# Patient Record
Sex: Female | Born: 1960 | Smoking: Never smoker
Health system: Southern US, Community
[De-identification: ages and names within clinical notes are randomized; demographics above are authoritative.]

## PROBLEM LIST (undated history)

## (undated) DIAGNOSIS — F32A Depression, unspecified: Secondary | ICD-10-CM

## (undated) DIAGNOSIS — F329 Major depressive disorder, single episode, unspecified: Secondary | ICD-10-CM

## (undated) DIAGNOSIS — J45909 Unspecified asthma, uncomplicated: Secondary | ICD-10-CM

## (undated) DIAGNOSIS — T7840XA Allergy, unspecified, initial encounter: Secondary | ICD-10-CM

## (undated) HISTORY — DX: Depression, unspecified: F32.A

## (undated) HISTORY — DX: Unspecified asthma, uncomplicated: J45.909

## (undated) HISTORY — DX: Allergy, unspecified, initial encounter: T78.40XA

## (undated) HISTORY — DX: Major depressive disorder, single episode, unspecified: F32.9

## (undated) HISTORY — PX: OTHER SURGICAL HISTORY: SHX169

## (undated) NOTE — Assessment & Plan Note (Signed)
Associated Problem(s): Asthma, exercise induced  Formatting of this note might be different from the original.  Stable. Well controlled. Continue prn treatment.   Electronically signed by Porfirio Oar, PA-C at 05/28/2022  3:18 PM EST

## (undated) NOTE — Progress Notes (Signed)
Formatting of this note is different from the original.  Images from the original note were not included.      Patient: Meghan Cooper      DOB: December 21, 1960, age 12 y.o.  MRN: [id number]    PCP: Porfirio Oar, PA-C    Assessment and Plan     1. Annual physical exam (Primary)  Comments:  age appropriate health guidance provided.  Orders:  -     CBC And Differential; Future; Expected date: 05/24/2022  -     Comprehensive Metabolic Panel; Future; Expected date: 05/31/2022  -     Lipid Panel With LDL/HDL Ratio; Future; Expected date: 05/31/2022  2. Encounter for screening mammogram for breast cancer  -     Mammo 3D Tomo Screening Bilateral; Future; Expected date: 06/23/2022  3. Vitamin D deficiency  -     Vitamin D 25 Hydroxy; Future; Expected date: 05/31/2022  4. Attention deficit hyperactivity disorder (ADHD), predominantly inattentive type  Assessment & Plan:  Stable. Well controlled. Continue current treatment per psychiatry.   5. Primary hypertension  Assessment & Plan:  Stable. Well controlled. Continue current treatment.    Orders:  -     CBC And Differential; Future; Expected date: 05/24/2022  -     Comprehensive Metabolic Panel; Future; Expected date: 05/31/2022  -     Urinalysis; Future; Expected date: 05/24/2022  6. Screening for colon cancer  -     Ambulatory referral to Gastroenterology  7. Asthma, exercise induced  Assessment & Plan:  Stable. Well controlled. Continue prn treatment.   Orders:  -     albuterol sulfate HFA (PROVENTIL,VENTOLIN,PROAIR) 108 (90 Base) MCG/ACT inhaler; Inhale two puffs into the lungs every 4 (four) hours as needed for Wheezing or Shortness of Breath., Starting Tue 05/24/2022, Normal      Follow up for Annual Exam, and fasting labs, sooner if needed.    Risks, benefits, and alternatives of the medications and treatment plan prescribed today were discussed, and patient expressed understanding. Plan follow-up as discussed or as needed if any worsening symptoms or change in condition.   Patient voiced understanding of the treatment plan and agreed to attempt to comply.      Subjective     This is a 63 y.o. female who presents with:     Patient presents with    Annual Exam       Chief complaint comments reviewed and discussed with patient in detail.    HPI     This 7 y.o. female presents for Annual Exam.    Cervical Cancer Screening: Last pap 12/06/2017, normal cytology, negative HPV. History of previous abnormal pap? no.  Breast Cancer Screening: Last mammogram 07/15/2021, normal. SBE? yes. Annual CBE? yes.  Colorectal Cancer Screening: not yet performed  Bone Density Testing: not yet a candidate  HIV Screening: declines  STI Screening: declines  COVID-19 Vaccination: current  Seasonal Influenza Vaccination: current  Td/Tdap Vaccination: Tdap 08/14/2019  Pneumococcal Vaccination: not yet a candidate  Zoster Vaccination: defers  Frequency of Dental evaluation: Q6 months  Frequency of Eye evaluation: annually    ADHD.  Stable and controlled. Managed by [employer].    HTN.  Continues to tolerate amlodipine.    Foot pain, due to arthritis, LEFT tibialis posterior tendonitis, meloxicam use.  Meloxicam eases foot pain and allows for regular exercise.    Notes occasional BRBPR attributed to a known hemorrhoid.  Episodic LEFT trapezius spasm associated with stress.    Last Resulted  Components       Date/Time Component Value Flag Units Reference Range Lab Status    04/28/21 1024 CHOL 264 High mg/dL 756 - 433 mg/dL Final result    29/51/88 1024 HDL 75 -- mg/dL >41 mg/dL Final result    66/06/30 1024 LDL 178 High mg/dL 0 - 99 mg/dL Final result    16/01/09 1024 TRIG 67 -- mg/dL 0 - 323 mg/dL Final result    55/73/22 1024 GLUCOSE 97 -- mg/dL 70 - 99 mg/dL Final result         Lab Results   Component Value Date    Creatinine 0.77 05/24/2022     Review of systems:  Constitutional: (-) Fatigue, (-) Unexpected weight change, (-) Fever, (-) Appetite change  ENT:  (-) Tinnitus, (-) Trouble  swallowing  Eyes: (-) Photophobia, (-) Visual disturbance  Lungs: (-) Cough, (-) Shortness of breath, (-) Wheezing,(-) Hemoptysis  Heart: (-) Chest pain, (-) Leg swelling, (-) Palpitations  Gastrointestinal: (-) Abdominal pain, (-) Nausea,  (-) Constipation, (-) Diarrhea,  (-) Blood in stool  Endocrine: (-) Polydipsia (-) Polyuria  Genitourinary: (-) Urinary urgency,  (-) Dyspareunia,  (-) Difficulty urinating  Musculoskeletal: (-) Arthralgia,  (+) Myalgia  Skin: (-) Rash,  (-) Skin color change,  (-) Wound  Allergy: (-) Environmental allergies (-) Food allergies  Neurologic: (-) Weakness, (-) Numbness,  (+) Sleep disturbance, (-) Headache  Hematology: (-) Adenopathy,  (-) Easy bruising or bleeding  Psychiatric: (-) Dysphoria,  (-) Anxious        Patient Active Problem List   Diagnosis    Asthma, exercise induced    BMI 34.0-34.9,adult    Perimenopause    Vitamin D deficiency    Rosacea    Class 1 obesity without serious comorbidity with body mass index (BMI) of 34.0 to 34.9 in adult    Tibialis posterior tendonitis, left    Arthritis of foot    Attention deficit hyperactivity disorder (ADHD), predominantly inattentive type    Primary hypertension     Past Medical History:   Diagnosis Date    ADHD (attention deficit hyperactivity disorder)     Arthritis 11/21    In left ankle and fingers    Asthma, exercise induced 10/03/2013    Hemorrhoids     Hypertension 12/24    Posterior tibial artery injury     LEFT, Dr. Charlsie Merles    Rosacea     Stress fracture of left ankle      Allergies   Allergen Reactions    Codeine Rash     Rash    Loracarbef Rash     Rash     Past Surgical History:   Procedure Laterality Date    Bunionectomy Left 1997     Family History   Problem Relation Age of Onset    Lung cancer Mother     Cancer Mother         Lung Cancer, passed in 1996    Heart disease Father 66        deceased    Heart attack Father     No Known Problems Brother     Hypertension Maternal Grandmother         deceased     Social  History     Socioeconomic History    Marital status: Married     Spouse name: Rob Press photographer    Number of children: 0    Highest education level: Biochemist, clinical History  Occupation: Professor of Chemistry     Comment: Doctor, general practice since 1991   Tobacco Use    Smoking status: Never     Passive exposure: Never    Smokeless tobacco: Never   Substance and Sexual Activity    Alcohol use: Yes     Alcohol/week: 10.0 standard drinks of alcohol     Types: 10 Glasses of wine per week    Drug use: Never    Sexual activity: Yes     Partners: Male     Birth control/protection: Post-menopausal   Social History Narrative    Lives with her husband, and their cats       Objective     Vitals:    05/24/22 0855   BP: 124/78   Patient Position: Sitting   Pulse: 87   Temp: 97.5 F (36.4 C)   TempSrc: Temporal   Height: 5\' 4"  (1.626 m)   Weight: Comment: Patirnt declined   SpO2: 97%   PainSc: 0-No pain     Wt Readings from Last 3 Encounters:   12/06/17 203 lb 9.6 oz (92.4 kg)     Physical Exam  Constitutional:       General: She is not in acute distress.     Appearance: Normal appearance. She is well-developed.   HENT:      Head: Normocephalic and atraumatic.      Right Ear: Hearing, tympanic membrane, ear canal and external ear normal.      Left Ear: Hearing, tympanic membrane, ear canal and external ear normal.      Nose: Nose normal.      Mouth/Throat:      Mouth: Mucous membranes are moist. No oral lesions.      Dentition: Normal dentition.      Pharynx: Oropharynx is clear. Uvula midline.   Eyes:      General: Lids are normal. No scleral icterus.        Right eye: No discharge or hordeolum.         Left eye: No discharge or hordeolum.      Conjunctiva/sclera: Conjunctivae normal.      Right eye: No exudate.     Left eye: No exudate.     Pupils: Pupils are equal, round, and reactive to light.      Funduscopic exam:     Right eye: No hemorrhage, exudate or papilledema.         Left eye: No hemorrhage, exudate or  papilledema.   Neck:      Thyroid: No thyroid mass or thyromegaly.      Trachea: Phonation normal.   Cardiovascular:      Rate and Rhythm: Normal rate and regular rhythm. No extrasystoles are present.     Pulses:           Radial pulses are 2+ on the right side and 2+ on the left side.      Heart sounds: Normal heart sounds. No murmur heard.     No friction rub. No gallop.   Musculoskeletal:      Cervical back: Full passive range of motion without pain, normal range of motion and neck supple.      Thoracic back: Normal.      Lumbar back: Normal.      Right hip: Normal.      Left hip: Normal.      Right knee: Normal.      Left knee: Normal.      Right ankle: Normal.  Left ankle: Normal.   Pulmonary:      Effort: Pulmonary effort is normal.      Breath sounds: Normal breath sounds.   Chest:   Breasts:     Breasts are symmetrical.      Right: No inverted nipple, mass, nipple discharge, skin change or tenderness.      Left: No inverted nipple, mass, nipple discharge, skin change or tenderness.   Abdominal:      General: Bowel sounds are normal.      Palpations: Abdomen is soft.      Tenderness: There is no abdominal tenderness.      Hernia: No hernia is present.   Lymphadenopathy:      Head:      Right side of head: No submental, submandibular or tonsillar adenopathy.      Left side of head: No submental, submandibular or tonsillar adenopathy.      Cervical: No cervical adenopathy.      Upper Body:      Right upper body: No supraclavicular adenopathy.      Left upper body: No supraclavicular adenopathy.   Skin:     General: Skin is warm and dry.      Findings: No rash.   Neurological:      General: No focal deficit present.      Mental Status: She is alert and oriented to person, place, and time.      Cranial Nerves: No cranial nerve deficit.      Sensory: No sensory deficit.      Deep Tendon Reflexes:      Reflex Scores:       Bicep reflexes are 2+ on the right side and 2+ on the left side.       Patellar reflexes  are 2+ on the right side and 2+ on the left side.  Psychiatric:         Attention and Perception: Attention and perception normal.         Mood and Affect: Mood and affect normal.         Speech: Speech normal.         Behavior: Behavior normal. Behavior is cooperative.         Thought Content: Thought content normal.         Cognition and Memory: Cognition and memory normal.         Judgment: Judgment normal.         Fernande Bras, PA-C      Electronically signed by Porfirio Oar, PA-C at 05/28/2022  3:23 PM EST

## (undated) NOTE — Assessment & Plan Note (Signed)
Associated Problem(s): Attention deficit hyperactivity disorder (ADHD), predominantly inattentive type  Formatting of this note might be different from the original.  Stable. Well controlled. Continue current treatment per psychiatry.   Electronically signed by Porfirio Oar, PA-C at 05/28/2022  3:18 PM EST

## (undated) NOTE — Assessment & Plan Note (Signed)
Associated Problem(s): Primary hypertension  Formatting of this note might be different from the original.  Stable. Well controlled. Continue current treatment.    Electronically signed by Porfirio Oar, PA-C at 05/28/2022  3:18 PM EST

---

## 2001-12-17 ENCOUNTER — Other Ambulatory Visit: Admission: RE | Admit: 2001-12-17 | Discharge: 2001-12-17 | Payer: Self-pay | Admitting: *Deleted

## 2004-02-24 ENCOUNTER — Other Ambulatory Visit: Admission: RE | Admit: 2004-02-24 | Discharge: 2004-02-24 | Payer: Self-pay | Admitting: *Deleted

## 2008-08-16 IMAGING — CT CT ABD-PELV W/O CM
3 of 8 series · 12 of 42 positions shown, 18 images · IV contrast (CONTRAST)
Comparison: NONE

CLINICAL DATA: Lower abdominal pain and cramping. 

CT ABDOMEN AND PELVIS WITHOUT AND WITH INTRAVENOUS AND FOLLOWING 
ORAL  CONTRAST
TECHNIQUE: Multiple axial 5-millimeter thick slices at 
5-millimeter intervals were obtained from the lung base through 
the pelvis following the intravenous administration of 100 cc of 
Optiray 350 at a rate of 3 cc per second.  Oral contrast was 
administered as well.  Arterial and venous phase imaging was 
obtained in the upper abdomen with delayed images obtained through 
the pelvis.

[Series 4: venous · axial · portal-venous · 0.64mm/px · z∈[+651,+931]mm · 5 of 85 slices shown, 10 images]
[im 15/85  soft-tissue]
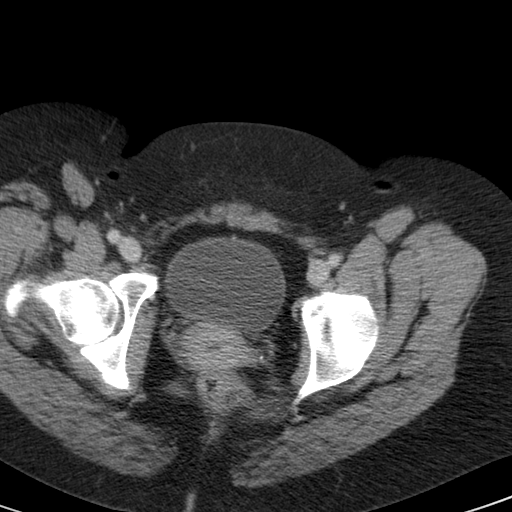
[im 15/85  bone]
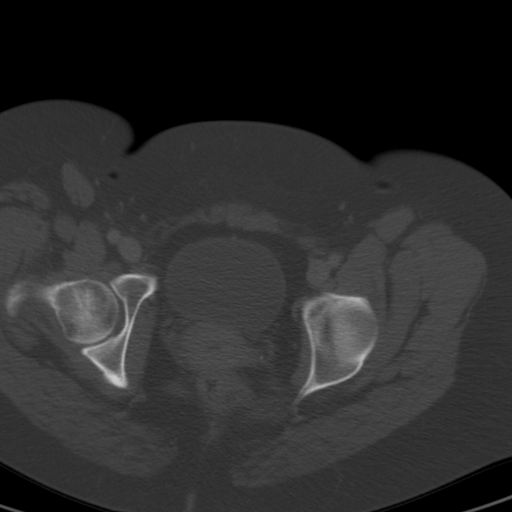
[im 29/85  soft-tissue]
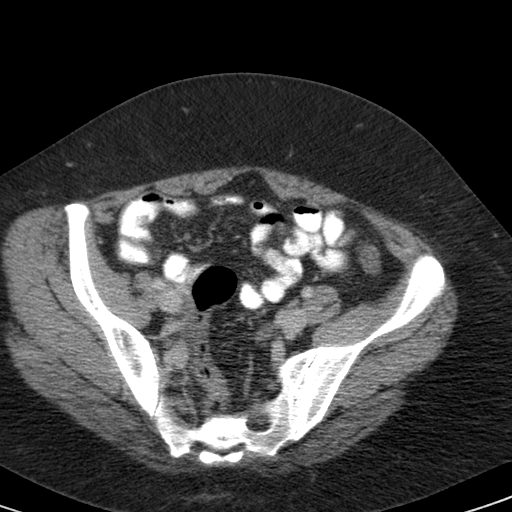
[im 29/85  lung]
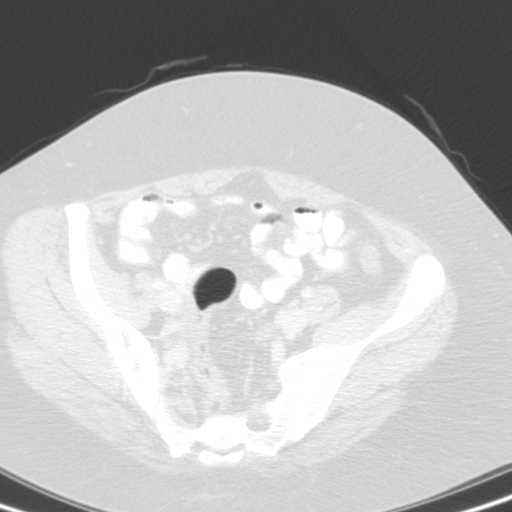
[im 43/85  soft-tissue]
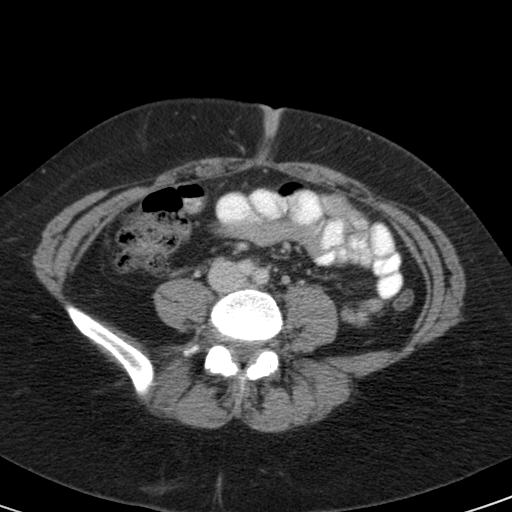
[im 43/85  lung]
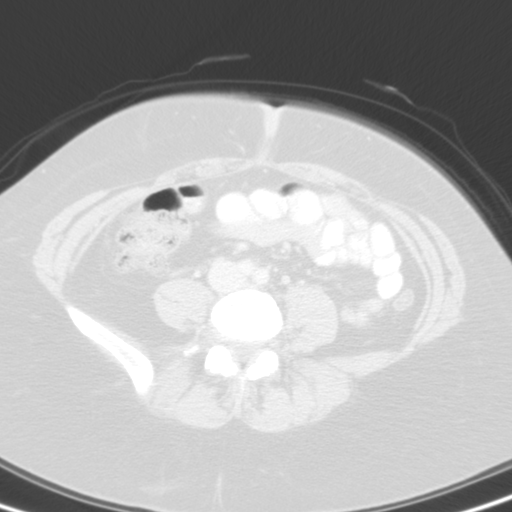
[im 57/85  soft-tissue]
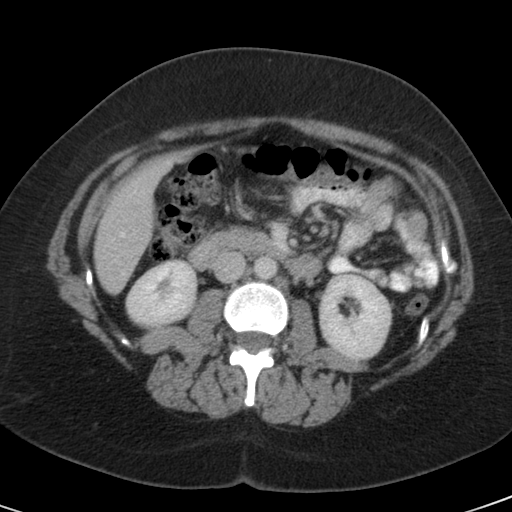
[im 57/85  lung]
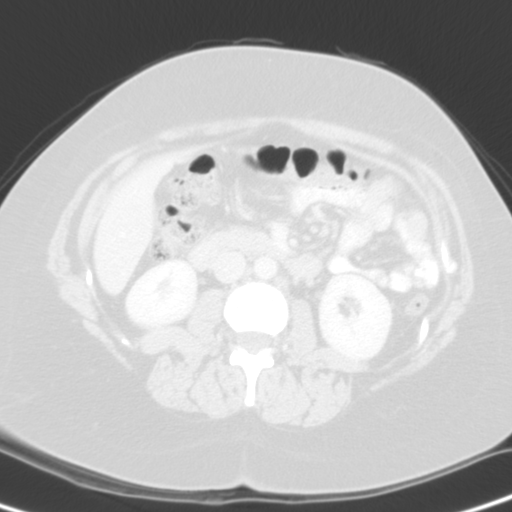
[im 71/85  soft-tissue]
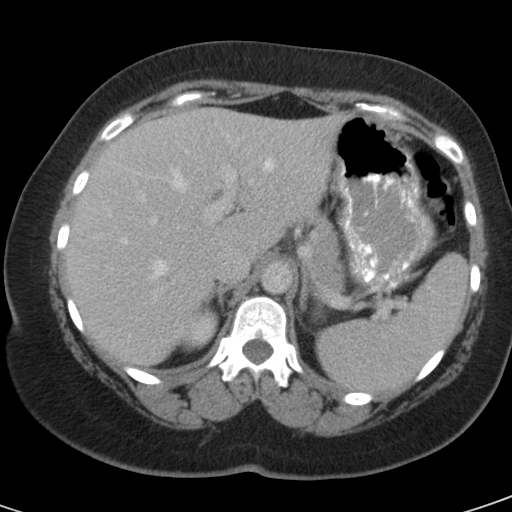
[im 71/85  lung]
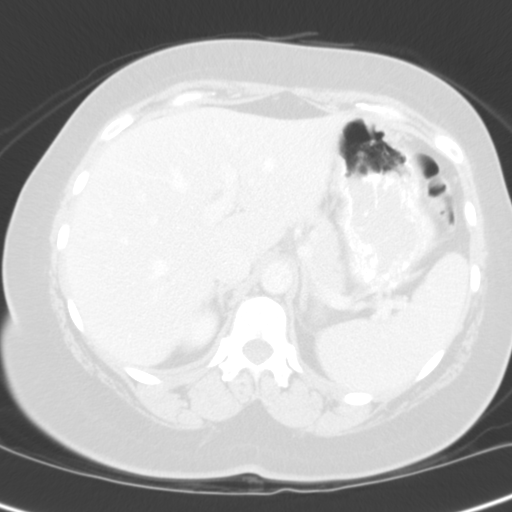

[Series 9: bladder delays · axial · 0.64mm/px · z∈[+684,+894]mm · 4 of 70 slices shown]
[im 14/70  soft-tissue]
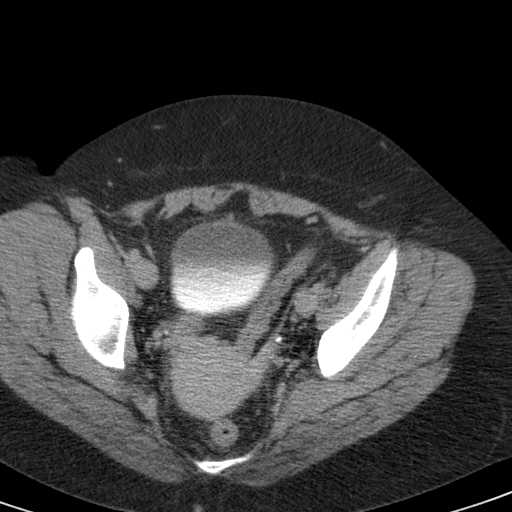
[im 28/70  soft-tissue]
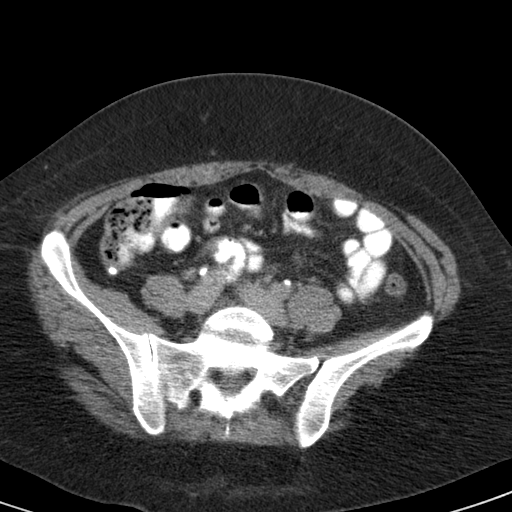
[im 42/70  soft-tissue]
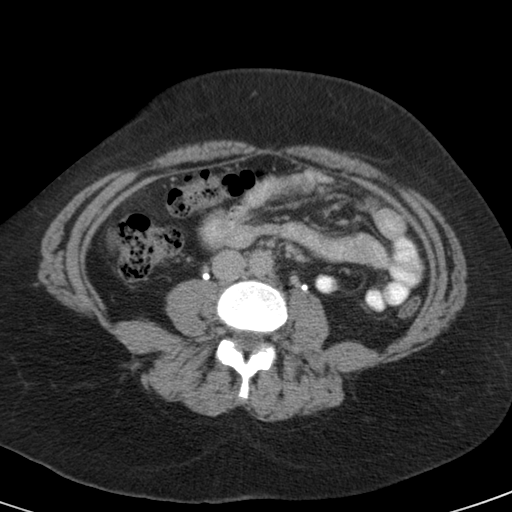
[im 56/70  soft-tissue]
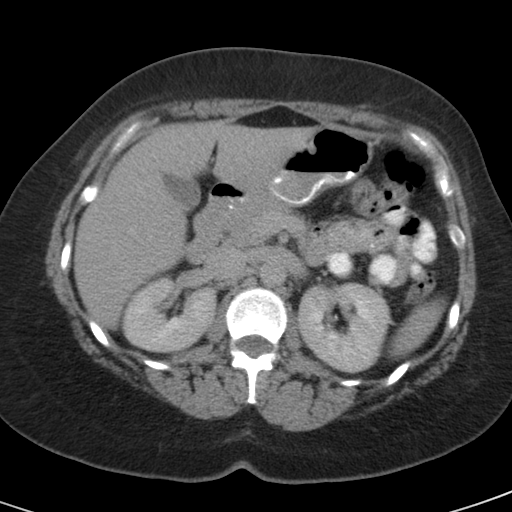

[coronals · coronal · 0.82mm/px · 3 of 72 slices shown, 4 images]
[im 24/72  soft-tissue]
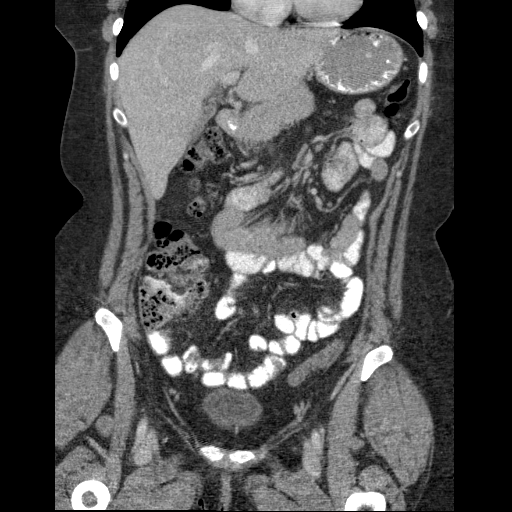
[im 32/72  soft-tissue]
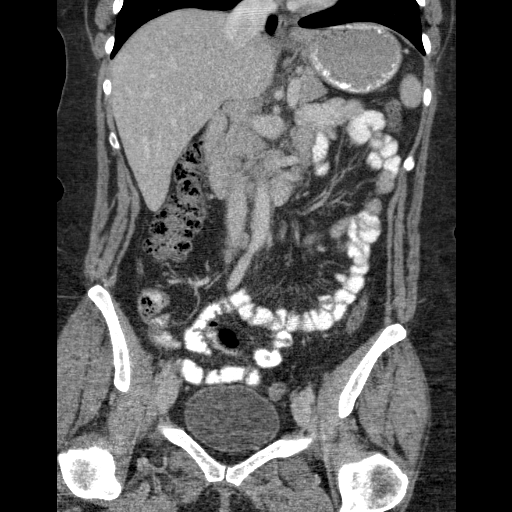
[im 32/72  bone]
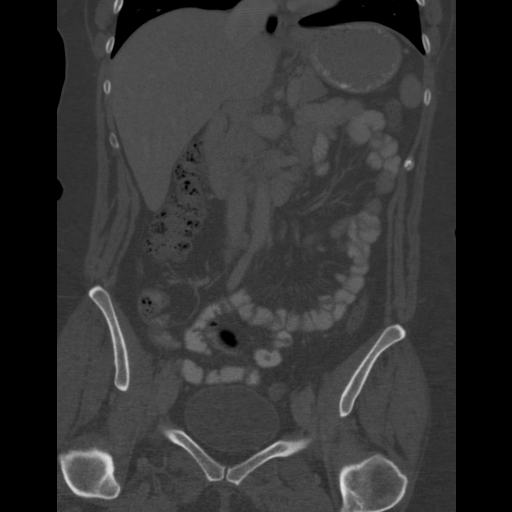
[im 40/72  soft-tissue]
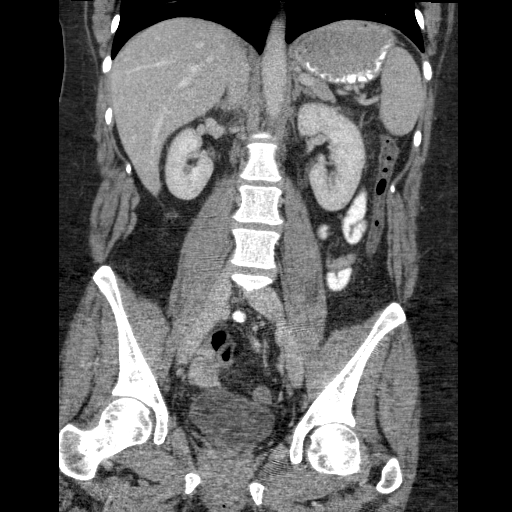

[12 of 42 positions shown; findings below may reference images not displayed]

FINDINGS: The liver, pancreas, spleen, kidneys, and aorta are 
unremarkable.  No adrenal or renal mass or evidence of renal 
obstruction. There is a calcification in the right lower quadrant 
measuring approximately 5-6 mm.  The appendix does not appear 
inflamed or enlarged. No evidence of appendicitis, diverticulitis, 
or hernia. No lung base mass, infiltrate, edema, or effusion. No 
lytic or blastic lesions are identified.
IMPRESSION: Appendicolith without direct or indirect evidence of 
appendicitis. Ronlor, Ingunn Harpa electronically reviewed on 
08/29/2006 Dict Date: 08/28/2006  Tran Date: 08/29/2006 DAS  JLM

## 2009-11-17 ENCOUNTER — Other Ambulatory Visit: Admission: RE | Admit: 2009-11-17 | Discharge: 2009-11-17 | Payer: Self-pay | Admitting: Family Medicine

## 2011-04-27 ENCOUNTER — Ambulatory Visit (INDEPENDENT_AMBULATORY_CARE_PROVIDER_SITE_OTHER): Payer: 59

## 2011-04-27 DIAGNOSIS — J45909 Unspecified asthma, uncomplicated: Secondary | ICD-10-CM

## 2012-02-09 ENCOUNTER — Encounter: Payer: Self-pay | Admitting: Family Medicine

## 2012-02-09 ENCOUNTER — Ambulatory Visit (INDEPENDENT_AMBULATORY_CARE_PROVIDER_SITE_OTHER): Payer: 59 | Admitting: Family Medicine

## 2012-02-09 VITALS — BP 126/86 | HR 60 | Temp 98.1°F | Resp 16 | Ht 64.0 in | Wt 177.8 lb

## 2012-02-09 DIAGNOSIS — Z23 Encounter for immunization: Secondary | ICD-10-CM

## 2012-02-09 DIAGNOSIS — Z01419 Encounter for gynecological examination (general) (routine) without abnormal findings: Secondary | ICD-10-CM

## 2012-02-09 DIAGNOSIS — Z1211 Encounter for screening for malignant neoplasm of colon: Secondary | ICD-10-CM

## 2012-02-09 DIAGNOSIS — N951 Menopausal and female climacteric states: Secondary | ICD-10-CM | POA: Insufficient documentation

## 2012-02-09 DIAGNOSIS — Z13220 Encounter for screening for lipoid disorders: Secondary | ICD-10-CM

## 2012-02-09 DIAGNOSIS — E559 Vitamin D deficiency, unspecified: Secondary | ICD-10-CM

## 2012-02-09 DIAGNOSIS — Z0000 Encounter for general adult medical examination without abnormal findings: Secondary | ICD-10-CM

## 2012-02-09 DIAGNOSIS — Z1231 Encounter for screening mammogram for malignant neoplasm of breast: Secondary | ICD-10-CM

## 2012-02-09 DIAGNOSIS — E669 Obesity, unspecified: Secondary | ICD-10-CM

## 2012-02-09 LAB — COMPREHENSIVE METABOLIC PANEL
AST: 21 U/L (ref 0–37)
BUN: 17 mg/dL (ref 6–23)
Calcium: 9.4 mg/dL (ref 8.4–10.5)
Chloride: 104 mEq/L (ref 96–112)
Creat: 0.82 mg/dL (ref 0.50–1.10)
Total Bilirubin: 0.7 mg/dL (ref 0.3–1.2)

## 2012-02-09 LAB — POCT URINALYSIS DIPSTICK
Bilirubin, UA: NEGATIVE
Blood, UA: NEGATIVE
Glucose, UA: NEGATIVE
Spec Grav, UA: 1.01

## 2012-02-09 LAB — CBC WITH DIFFERENTIAL/PLATELET
Basophils Absolute: 0 10*3/uL (ref 0.0–0.1)
Eosinophils Absolute: 0.1 10*3/uL (ref 0.0–0.7)
Eosinophils Relative: 1 % (ref 0–5)
Lymphocytes Relative: 37 % (ref 12–46)
MCH: 31.2 pg (ref 26.0–34.0)
MCV: 92.5 fL (ref 78.0–100.0)
Neutrophils Relative %: 51 % (ref 43–77)
Platelets: 216 10*3/uL (ref 150–400)
RDW: 13.5 % (ref 11.5–15.5)
WBC: 4.3 10*3/uL (ref 4.0–10.5)

## 2012-02-09 LAB — LIPID PANEL
HDL: 83 mg/dL (ref >39)
Total CHOL/HDL Ratio: 2.8 Ratio
VLDL: 10 mg/dL (ref 0–40)

## 2012-02-09 NOTE — Patient Instructions (Signed)
Keeping You Healthy  Get These Tests  Blood Pressure- Have your blood pressure checked by your healthcare provider at least once a year.  Normal blood pressure is 120/80.  Weight- Have your body mass index (BMI) calculated to screen for obesity.  BMI is a measure of body fat based on height and weight.  You can calculate your own BMI at https://www.west-esparza.com/  Cholesterol- Have your cholesterol checked every year.  Diabetes- Have your blood sugar checked every year if you have high blood pressure, high cholesterol, a family history of diabetes or if you are overweight.  Pap Smear- Have a pap smear every 1 to 3 years if you have been sexually active.  If you are older than 65 and recent pap smears have been normal you may not need additional pap smears.  In addition, if you have had a hysterectomy  For benign disease additional pap smears are not necessary.  Mammogram-Yearly mammograms are essential for early detection of breast cancer  Screening for Colon Cancer- Colonoscopy starting at age 79. Screening may begin sooner depending on your family history and other health conditions.  Follow up colonoscopy as directed by your Gastroenterologist.  Screening for Osteoporosis- Screening begins at age 74 with bone density scanning, sooner if you are at higher risk for developing Osteoporosis.  Get these medicines  Calcium with Vitamin D- Your body requires 1200-1500 mg of Calcium a day and [phone] IU of Vitamin D a day.  You can only absorb 500 mg of Calcium at a time therefore Calcium must be taken in 2 or 3 separate doses throughout the day.  Hormones- Hormone therapy has been associated with increased risk for certain cancers and heart disease.  Talk to your healthcare provider about if you need relief from menopausal symptoms.  Aspirin- Ask your healthcare provider about taking Aspirin to prevent Heart Disease and Stroke.  Get these Immuniztions  Flu shot- Every fall You received your  2012-2013 Flu vaccine today.  Pneumonia shot- Once after the age of 48; if you are younger ask your healthcare provider if you need a pneumonia shot.  Tetanus- Every ten years.  You had T-DaP in July 2011 at [place] [facility]  Zostavax- Once after the age of 35 to prevent shingles.  Take these steps  Don't smoke- Your healthcare provider can help you quit. For tips on how to quit, ask your healthcare provider or go to www.smokefree.gov or call 1-800 QUIT-NOW.  Be physically active- Exercise 5 days a week for a minimum of 30 minutes.  If you are not already physically active, start slow and gradually work up to 30 minutes of moderate physical activity.  Try walking, dancing, bike riding, swimming, etc.  Eat a healthy diet- Eat a variety of healthy foods such as fruits, vegetables, whole grains, low fat milk, low fat cheeses, yogurt, lean meats, chicken, fish, eggs, dried beans, tofu, etc.  For more information go to www.thenutritionsource.org  Dental visit- Brush and floss teeth twice daily; visit your dentist twice a year.  Eye exam- Visit your Optometrist or Ophthalmologist yearly.  Drink alcohol in moderation- Limit alcohol intake to one drink or less a day.  Never drink and drive.  Depression- Your emotional health is as important as your physical health.  If you're feeling down or losing interest in things you normally enjoy, please talk to your healthcare provider.  Seat Belts- can save your life; always wear one  Smoke/Carbon Monoxide detectors- These detectors need to be installed  on the appropriate level of your home.  Replace batteries at least once a year.  Violence- If anyone is threatening or hurting you, please tell your healthcare provider.  Living Will/ Health care power of attorney- Discuss with your healthcare provider and family.   Vitamin D Deficiency Vitamin D is an important vitamin that your body needs. Having too little of it in your body is called a  deficiency. A very bad deficiency can make your bones soft and can cause a condition called rickets.  Vitamin D is important to your body for different reasons, such as:   It helps your body absorb 2 minerals called calcium and phosphorus.  It helps make your bones healthy.  It may prevent some diseases, such as diabetes and multiple sclerosis.  It helps your muscles and heart. You can get vitamin D in several ways. It is a natural part of some foods. The vitamin is also added to some dairy products and cereals. Some people take vitamin D supplements. Also, your body makes vitamin D when you are in the sun. It changes the sun's rays into a form of the vitamin that your body can use. CAUSES   Not eating enough foods that contain vitamin D.  Not getting enough sunlight.  Having certain digestive system diseases that make it hard to absorb vitamin D. These diseases include Crohn's disease, chronic pancreatitis, and cystic fibrosis.  Having a surgery in which part of the stomach or small intestine is removed.  Being obese. Fat cells pull vitamin D out of your blood. That means that obese people may not have enough vitamin D left in their blood and in other body tissues.  Having chronic kidney or liver disease. RISK FACTORS Risk factors are things that make you more likely to develop a vitamin D deficiency. They include:  Being older.  Not being able to get outside very much.  Living in a nursing home.  Having had broken bones.  Having weak or thin bones (osteoporosis).  Having a disease or condition that changes how your body absorbs vitamin D.  Having dark skin.  Some medicines such as seizure medicines or steroids.  Being overweight or obese. SYMPTOMS Mild cases of vitamin D deficiency may not have any symptoms. If you have a very bad case, symptoms may include:  Bone pain.  Muscle pain.  Falling often.  Broken bones caused by a minor injury, due to  osteoporosis. DIAGNOSIS A blood test is the best way to tell if you have a vitamin D deficiency. TREATMENT Vitamin D deficiency can be treated in different ways. Treatment for vitamin D deficiency depends on what is causing it. Options include:  Taking vitamin D supplements.  Taking a calcium supplement. Your caregiver will suggest what dose is best for you. HOME CARE INSTRUCTIONS  Take any supplements that your caregiver prescribes. Follow the directions carefully. Take only the suggested amount.  Have your blood tested 2 months after you start taking supplements.  Eat foods that contain vitamin D. Healthy choices include:  Fortified dairy products, cereals, or juices. Fortified means vitamin D has been added to the food. Check the label on the package to be sure.  Fatty fish like salmon or trout.  Eggs.  Oysters.  Spend some time in the sun. Most people should get out in the sun without sunblock for 10 to 15 minutes at a time. Do this 3 times a week. People who have had skin cancer should not do this. Ask  your caregiver how long you should be in the sun. Do not use a tanning bed.  Keep your weight at a healthy level. Lose weight if you need to.  Keep all follow-up appointments. Your caregiver will need to perform blood tests to make sure your vitamin D deficiency is going away. SEEK MEDICAL CARE IF:  You have any questions about your treatment.  You continue to have symptoms of vitamin D deficiency.  You have nausea or vomiting.  You are constipated.  You feel confused.  You have severe abdominal or back pain. MAKE SURE YOU:  Understand these instructions.  Will watch your condition.  Will get help right away if you are not doing well or get worse. Document Released: 07/04/2011 Document Reviewed: 07/04/2011 [facility] Patient Information 2013 [place], [place].    Menopause and Herbal Products Menopause is the normal time of life when menstrual periods stop  completely. Menopause is complete when you have missed 12 consecutive menstrual periods. It usually occurs between the ages of 51 to 75, with an average age of 105. Very rarely does a woman develop menopause before 51 years old. At menopause, your ovaries stop producing the female hormones, estrogen and progesterone. This can cause undesirable symptoms and also affect your health. Sometimes the symptoms can occur 4 to 5 years before the menopause begins. There is no relationship between menopause and:  Oral contraceptives.  Number of children you had.  Race.  The age your menstrual periods started (menarche). Heavy smokers and very thin women may develop menopause earlier in life. Estrogen and progesterone hormone treatment is the usual method of treating menopausal symptoms. However, there are women who should not take hormone treatment. This is true of:   Women that have breast or uterine cancer.  Women who prefer not to take hormones because of certain side effects (abnormal uterine bleeding).  Women who are afraid that hormones may cause breast cancer.  Women who have a history of liver disease, heart disease, stroke, or blood clots. For these women, there are other medications that may help treat their menopausal symptoms. These medications are found in plants and botanical products. They can be found in the form of herbs, teas, oils, tinctures, and pills.  CAUSES:  The ovaries stop producing the female hormones estrogen and progesterone.  Other causes include:  Surgery to remove both ovaries.  The ovaries stop functioning for no know reason.  Tumors of the pituitary gland in the brain.  Medical disease that affects the ovaries and hormone production.  Radiation treatment to the abdomen or pelvis.  Chemotherapy that affects the ovaries. PHYTOESTROGENS: Phytoestrogen's occur naturally in plants and plant products. They act like estrogen in the body. Herbal medications are  made from these plants and botanical steroids. There are 3 types of phytoestrogens:  Isoflavones (genistein and daidzein) are found in soy, garbanzo beans, miso and tofu foods.  Ligins are found in the shell of seeds. They are used to make oils like flaxseed oil. The bacteria in your intestine act on these foods to produce the estrogen-like hormones.  Coumestans are estrogen-like. Some of the foods they are found in include sunflower seeds and bean sprouts. CONDITIONS AND THERE POSSIBLE HERBAL TREATMENT:  Hot flashes and night sweats.  Soy, black cohosh and evening primrose.  Irritability, insomnia, depression and memory problems.  Chasteberry, ginseng, and soy.  St. John's wort may be helpful for depression. However, there is a concern of it causing cataracts of the eye and may have  bad effects on other medications. St. John's wort should not be taken for long time and without your caregiver's advice.  Loss of libido and vaginal and skin dryness.  Wild yam and soy.  Prevention of coronary heart disease and osteoporosis.  Soy and Isoflavones. Several studies have shown that some women benefit from herbal medications, but most of the studies have not consistently shown that these supplements are much better than placebo. Other forms of treatment to help women with menopausal symptoms include a balanced diet, rest, exercise, vitamin and calcium (with vitamin D) supplements, acupuncture, and group therapy when necessary. THOSE WHO SHOULD NOT TAKE HERBAL MEDICATIONS INCLUDE:  Women who are planning on getting pregnant unless told by your caregiver.  Women who are breastfeeding unless told by your caregiver.  Women who are taking other prescription medications unless told by your caregiver.  Infants, children, and elderly women unless told by your caregiver. Different herbal medications have different and unmeasured amounts of the herbal ingredients. There are no regulations, quality  control, and standardization of the ingredients in herbal medications. Therefore, the amount of the ingredient in the medication may vary from one herb, pill, tea, oil or tincture to another. Many herbal medications can cause serious problems and can even have poisonous effects if taken too much or too long. If problems develop, the medication should be stopped and recorded by your caregiver. HOME CARE INSTRUCTIONS  Do not take or give children herbal medications without your caregiver's advice.  Let your caregiver know all the medications you are taking. This includes prescription, over-the-counter, eye drops, and creams.  Do not take herbal medications longer or more than recommended.  Tell your caregiver about any side effects from the medication. SEEK MEDICAL CARE IF:  You develop a fever of 102 F (38.9 C), or as directed by your caregiver.  You feel sick to your stomach (nauseous), vomit, or have diarrhea.  You develop a rash.  You develop abdominal pain.  You develop severe headaches.  You start to have vision problems.  You feel dizzy or faint.  You start to feel numbness in any part of your body.  You start shaking (have convulsions). Document Released: 09/28/2007 Document Revised: 07/04/2011 Document Reviewed: 04/27/2010 [facility] Patient Information 2013 [place], [place].

## 2012-02-09 NOTE — Progress Notes (Signed)
Subjective:    Patient ID: Lori Reed, female    DOB: 02-02-1961, 51 y.o.   MRN: [id number]  HPI  This 51 y.o. Cauc female is here for CPE/PAP. She is in good health and takes no prescription  medications.  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 She does  consume wine almost daily and exercises daily for 1-2 hours.   Last PAP:  11/2009 (normal)  Last MMG: 11/2009 (normal)  CRS: has not had   Review of Systems  Constitutional: Negative.   HENT: Negative.   Eyes: Negative.   Respiratory: Negative.   Cardiovascular: Negative.   Gastrointestinal: Negative.   Genitourinary: Negative.   Musculoskeletal: Negative.   Skin: Negative.   Neurological: Negative.   Hematological: Negative.   Psychiatric/Behavioral: Negative.        Objective:   Physical Exam  Nursing note and vitals reviewed. Constitutional: She is oriented to person, place, and time. She appears well-developed and well-nourished. No distress.  HENT:  Head: Normocephalic and atraumatic.  Right Ear: Hearing, tympanic membrane, external ear and ear canal normal.  Left Ear: Hearing, tympanic membrane, external ear and ear canal normal.  Nose: Nose normal. No nasal deformity or septal deviation.  Mouth/Throat: Uvula is midline, oropharynx is clear and moist and mucous membranes are normal. No oral lesions. Normal dentition. No dental caries.  Eyes: Conjunctivae normal, EOM and lids are normal. Pupils are equal, round, and reactive to light. No scleral icterus.  Fundoscopic exam:      The right eye shows no arteriolar narrowing, no AV nicking and no papilledema. The right eye shows red reflex.      The left eye shows AV nicking. The left eye shows no arteriolar narrowing and no papilledema. The left eye shows red reflex. Neck: Normal range of motion. Neck supple. No thyromegaly present.  Cardiovascular: Normal rate, regular rhythm, normal heart sounds and intact distal pulses.  Exam reveals  no gallop and no friction rub.   No murmur heard. Pulmonary/Chest: Effort normal and breath sounds normal. No respiratory distress.  Abdominal: Soft. Normal appearance and bowel sounds are normal. She exhibits no distension, no abdominal bruit, no pulsatile midline mass and no mass. There is no hepatosplenomegaly. There is no tenderness. There is no guarding and no CVA tenderness. No hernia.  Genitourinary: Rectum normal. Rectal exam shows no external hemorrhoid, no fissure, no mass, no tenderness and anal tone normal. Guaiac negative stool. No breast swelling, tenderness, discharge or bleeding. There is no rash, tenderness or lesion on the right labia. There is no rash, tenderness or lesion on the left labia. Uterus is not deviated, not enlarged and not tender. Cervix exhibits no motion tenderness, no discharge and no friability. Right adnexum displays no mass, no tenderness and no fullness. Left adnexum displays no mass, no tenderness and no fullness. There is tenderness around the vagina. No erythema or bleeding around the vagina.  Musculoskeletal: Normal range of motion. She exhibits no edema and no tenderness.  Lymphadenopathy:    She has no cervical adenopathy.       Right: No inguinal adenopathy present.       Left: No inguinal adenopathy present.  Neurological: She is alert and oriented to person, place, and time. She has normal reflexes. No cranial nerve deficit. She exhibits normal muscle tone. Coordination normal.  Skin: Skin is warm and dry. No pallor.  Psychiatric: She has a normal mood and affect. Her behavior is normal. Judgment and  thought content normal.          Assessment & Plan:   1. Routine general medical examination at a health care facility  Comprehensive metabolic panel, CBC with Differential, IFOBT POC (occult bld, rslt in office), POCT urinalysis dipstick  2. Encounter for cervical Pap smear with pelvic exam  Pap IG w/ reflex to HPV when ASC-U  3. Perimenopause      4. Unspecified vitamin D deficiency -Vit D= 17.7 in July 2011 Vitamin D, 25-hydroxy  5. Need for prophylactic vaccination and inoculation against influenza  Flu vaccine greater than or equal to 3yo preservative free IM  6. Other screening mammogram  MM Digital Screening  7. Need for lipid screening  Lipid panel  8. Screening for colorectal cancer  Ambulatory referral to Gastroenterology  9. Obesity (BMI 30.0-34.9)  Continue healthy lifestyle choices

## 2012-02-10 NOTE — Progress Notes (Signed)
Quick Note:  Please advise pt that the following labs... Complete blood counts and metabolic panel are normal. PAP is normal.  Total cholesterol and LDL ("bad") cholesterol are above normal; you do have a very high HDL which is good. Based on these values, your risk of cardiovascular disease is less than 1/2 the average risk.(See CHD risk table below your values). You can improve the Total and LDL cholesterol values by eating healthier, using the "Mediterranean Diet" as a guide. Regular physical activity is beneficial also.  Vitamin D is a little below normal; goal is a value=50. Get An OTC Vit D3 supplement 2000 IU and take it daily or get Citracal + D3 and take it twice a day. 15 minutes of sun exposure most days of the week is helpful.  Copy to pt. ______

## 2012-02-12 ENCOUNTER — Encounter: Payer: Self-pay | Admitting: Family Medicine

## 2012-02-16 ENCOUNTER — Encounter: Payer: Self-pay | Admitting: Family Medicine

## 2012-02-16 NOTE — Telephone Encounter (Signed)
Mailed Pt her labs from 2011- called and notified Pt. Lori Reed

## 2012-08-24 ENCOUNTER — Telehealth: Payer: Self-pay

## 2012-08-24 ENCOUNTER — Ambulatory Visit (INDEPENDENT_AMBULATORY_CARE_PROVIDER_SITE_OTHER): Payer: 59 | Admitting: Internal Medicine

## 2012-08-24 DIAGNOSIS — Z23 Encounter for immunization: Secondary | ICD-10-CM

## 2012-08-24 DIAGNOSIS — Z789 Other specified health status: Secondary | ICD-10-CM

## 2012-08-24 MED ORDER — ATOVAQUONE-PROGUANIL HCL 250-100 MG PO TABS: 1.0000 | ORAL_TABLET | Freq: Every day | ORAL | Status: DC

## 2012-08-24 MED ORDER — AZITHROMYCIN 500 MG PO TABS: 500.0000 mg | ORAL_TABLET | Freq: Every day | ORAL | Status: DC

## 2012-08-24 NOTE — Telephone Encounter (Signed)
PT WOULD LIKE TO HAVE HER IMMUNIZATION RECORDS SENT TO DR.SNIDER.  FAX: [phone] [address] Pandy SNIDER  BEST# [phone]

## 2012-08-24 NOTE — Progress Notes (Signed)
[facility] TRAVEL CLINIC NOTE  RFV: upcoming trip to [place] from jun 1st thru 15th Subjective:    Patient ID: Lori Reed, female    DOB: 10-Oct-1960, 52 y.o.   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   Meds: ibuprofen Pmhx: cholesteremia Previously travelled to [place], [place], [place], [place], [place], [place] Imms: received flu vaccine this past year  Review of Systems     Objective:   Physical Exam        Assessment & Plan:  Travel vaccines recommendation : she will receive injectableTyphoid and hep A  #1 today. Yellow fever not needed for this itinerary  Malaria = malarone for exposure in [place] from 11/9 thru 21,  Travelers diarrhea =  azithromycin if needed for traveler's diarrhea, given tips sheet  Contact: [email or web address]

## 2012-08-27 NOTE — Telephone Encounter (Signed)
Patient needs to sign release.

## 2013-02-28 ENCOUNTER — Other Ambulatory Visit: Payer: Self-pay

## 2013-10-03 ENCOUNTER — Ambulatory Visit (INDEPENDENT_AMBULATORY_CARE_PROVIDER_SITE_OTHER): Payer: 59 | Admitting: Family Medicine

## 2013-10-03 ENCOUNTER — Encounter: Payer: Self-pay | Admitting: Family Medicine

## 2013-10-03 VITALS — BP 123/80 | HR 60 | Temp 98.1°F | Resp 18 | Ht 64.0 in | Wt 186.0 lb

## 2013-10-03 DIAGNOSIS — H6990 Unspecified Eustachian tube disorder, unspecified ear: Secondary | ICD-10-CM

## 2013-10-03 DIAGNOSIS — J45990 Exercise induced bronchospasm: Secondary | ICD-10-CM | POA: Insufficient documentation

## 2013-10-03 DIAGNOSIS — H6980 Other specified disorders of Eustachian tube, unspecified ear: Secondary | ICD-10-CM

## 2013-10-03 DIAGNOSIS — H9190 Unspecified hearing loss, unspecified ear: Secondary | ICD-10-CM

## 2013-10-03 MED ORDER — ALBUTEROL SULFATE HFA 108 (90 BASE) MCG/ACT IN AERS: 2.0000 | INHALATION_SPRAY | Freq: Four times a day (QID) | RESPIRATORY_TRACT | Status: DC | PRN

## 2013-10-03 MED ORDER — DESLORATADINE 5 MG PO TABS: 5.0000 mg | ORAL_TABLET | Freq: Every day | ORAL | Status: AC

## 2013-10-04 ENCOUNTER — Telehealth: Payer: Self-pay

## 2013-10-04 NOTE — Telephone Encounter (Signed)
PA needed for clarinex Rx. Dr Audria NineMcPherson, your notes are not completed yet for yesterday's OV and didn't know if pt has tried/failed any preferred meds (probably claritin and zyrtec). Do you know? Or would you want to Rx a preferred generic instead?

## 2013-10-04 NOTE — Telephone Encounter (Signed)
Pt got RX for Clarinex filled with a coupon given at the pharmacy; she paid for it out-of-povket= $38.00 but she really wanted to try it. She will let me know if it helps. If not,I advised her that she would need to try Claritin or Zyrtec and fail those meds before her insurer would cover the Clarinex.

## 2013-10-05 NOTE — Progress Notes (Signed)
S: This 53 y.o. Cauc female is here for evaluation of URI symptoms w/ cough and mild congestion, onset 2 weeks ago. Pt has been traveling, recently in [place]. Since flying back from [place], ears have felt "full" and slight sinus pressure. Pt c/o hearing impairment and mild tinnitus. Her maternal grandmother had hearing loss.  She c/o SOB w/ exercise, known to have exercised- induced Asthma. Pt denies fever/chills, fatigue, abnormal weight loss, appetite change, ear pain, facial pain or swelling, trouble swallowing, choking, wheezing, CP or tightness, edema, HA, dizziness or lightheadedness.   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   Patient Active Problem List   Diagnosis Date Noted  . Asthma, exercise induced 10/03/2013  . Perimenopause 02/09/2012  . Unspecified vitamin D deficiency 02/09/2012  . Obesity (BMI 30.0-34.9) 02/09/2012   Prior to Admission medications   Medication Sig Start Date End Date Taking? Authorizing Provider  ibuprofen (ADVIL,MOTRIN) 200 MG tablet Take 200 mg by mouth every 6 (six) hours as needed.   Yes Historical Provider, MD   PMHx, Surg Hx, Soc and Fam Hx reviewed.  ROS: As per HPI.  O: Filed Vitals:   10/03/13 0936  BP: 123/80  Pulse: 60  Temp: 98.1 F (36.7 C)  Resp: 18   GEN: In NAD; WN,WD. HEENT: [place]/AT; EOMI w/ clear conj/sclerae. Ext ears/EACs normal. R TM w/ scant effusion; L TM normal- no erythema, scarring, retraction or bulging. Nares clear w/ erythematous mucosa. Post pharynx w/ mild erythema, normal uvula- no exudate or lesions. Good dentition. Sinuses NT w/ percussion. NECK: Supple w/o LAN or TMG. LUNGS: CTA; normal resp effort. No wheezes or rhonchi. COR: RRR; normal S1 and S2. No m/g/r. SKIN: W&D; intact w/o erythema or rashes. NEURO: A&O x 3; CNs intact. Nonfocal.  A/P: Asthma, exercise induced- RX: Albuterol MDI  2 puffs 4 times/day prn SOB or cough.  Eustachian tube  dysfunction- RX; Clarinex 5 mg 1 tablet daily. Saline nasal mist for congestion.  Hearing impairment- Consider ENT / audiology evaluation if symptoms persist; pt wants to try medications and other conservative measures.    Meds ordered this encounter  Medications  . albuterol (PROVENTIL HFA;VENTOLIN HFA) 108 (90 BASE) MCG/ACT inhaler    Sig: Inhale 2 puffs into the lungs every 6 (six) hours as needed for wheezing or shortness of breath.    Dispense:  1 Inhaler    Refill:  2  . desloratadine (CLARINEX) 5 MG tablet    Sig: Take 1 tablet (5 mg total) by mouth daily.    Dispense:  30 tablet    Refill:  3

## 2013-10-07 NOTE — Telephone Encounter (Signed)
Faxed back this info to pharmacy. PA not required at this time.

## 2013-11-05 ENCOUNTER — Ambulatory Visit (INDEPENDENT_AMBULATORY_CARE_PROVIDER_SITE_OTHER): Payer: 59 | Admitting: Internal Medicine

## 2013-11-05 VITALS — BP 126/72 | HR 64 | Temp 98.1°F | Resp 18 | Ht 64.0 in | Wt 187.0 lb

## 2013-11-05 DIAGNOSIS — R21 Rash and other nonspecific skin eruption: Secondary | ICD-10-CM

## 2013-11-05 DIAGNOSIS — R059 Cough, unspecified: Secondary | ICD-10-CM

## 2013-11-05 DIAGNOSIS — R05 Cough: Secondary | ICD-10-CM

## 2013-11-05 LAB — CBC WITH DIFFERENTIAL/PLATELET
BASOS ABS: 0 10*3/uL (ref 0.0–0.1)
BASOS PCT: 0 % (ref 0–1)
EOS PCT: 4 % (ref 0–5)
Eosinophils Absolute: 0.3 10*3/uL (ref 0.0–0.7)
HEMATOCRIT: 42.8 % (ref 36.0–46.0)
Hemoglobin: 14.6 g/dL (ref 12.0–15.0)
LYMPHS PCT: 25 % (ref 12–46)
Lymphs Abs: 1.7 10*3/uL (ref 0.7–4.0)
MCH: 30.9 pg (ref 26.0–34.0)
MCHC: 34.1 g/dL (ref 30.0–36.0)
MCV: 90.5 fL (ref 78.0–100.0)
MONO ABS: 0.8 10*3/uL (ref 0.1–1.0)
Monocytes Relative: 11 % (ref 3–12)
NEUTROS ABS: 4.1 10*3/uL (ref 1.7–7.7)
Neutrophils Relative %: 60 % (ref 43–77)
PLATELETS: 222 10*3/uL (ref 150–400)
RBC: 4.73 MIL/uL (ref 3.87–5.11)
RDW: 13.4 % (ref 11.5–15.5)
WBC: 6.9 10*3/uL (ref 4.0–10.5)

## 2013-11-05 MED ORDER — FLUOCINONIDE-E 0.05 % EX CREA: 1.0000 "application " | TOPICAL_CREAM | Freq: Two times a day (BID) | CUTANEOUS | Status: AC

## 2013-11-05 NOTE — Progress Notes (Signed)
   Subjective:    Patient ID: Lori Reed, female    DOB: 1960/09/09, 53 y.o.   MRN: [id number]  HPI This is a very pleasant 53 yo female who presents today complaining of rash. Patient has had an itchy, red rash for about 1 week. She was in [place] when she noticed what she thought was a bug bite. Over the next several days, she developed more lesions. She felt a pin prick sensation prior to seeing a welt and blister. The lesions are warm and itchy. She has them on her arms, neck, back (inder bra straps), upper anterior thighs, top of feet. She was seen at an [facility] in [place] and prescribed Flucoxacillin 500 mg po BID for presumed staph infection. She has taken 12 doses and has continued to develop new lesions. She returned from her trip last night. She took benadryl last night for itching and slept well.  10 days ago had sore throat x 1 day, 1 day of fatigue and then developed a cough. Her cough has improved. She has been using an OTC cough suppressant and her albuterol inhaler as needed. She has not used it this morning. The cough is improving.   Her husband was traveling with her and he does not have any rash.  Review of Systems No fever/chills, no muscle aches. No new meds.    Objective:   Physical Exam  Vitals reviewed. Constitutional: She is oriented to person, place, and time. She appears well-developed and well-nourished.  HENT:  Head: Normocephalic and atraumatic.  Eyes: Conjunctivae are normal. Right eye exhibits no discharge. Left eye exhibits no discharge.  Neck: Normal range of motion. Neck supple.  Pulmonary/Chest: Effort normal and breath sounds normal. Wheezes: with forced expiration.  Musculoskeletal: Normal range of motion.  Neurological: She is alert and oriented to person, place, and time.  Skin: Skin is warm and dry.  Randomly scattered macules, papules and blisters in varying degrees of healing. Areas erythematous, spanning from 2-6 cm. Oldest lesion is on  the left forearm, it is flat with scabbing. Right forearm with 3 relatively flat bullous lesions with large area erythema. Right 4th finger with more pronounced bullous lesion and darker red. Oldest lesions with mild thickening of skin.   Psychiatric: She has a normal mood and affect. Her behavior is normal. Judgment and thought content normal.      Assessment & Plan:  Discussed with Dr. Merla Richesoolittle who examined the patient  1. Rash and nonspecific skin eruption - questionable post viral/viral exanthem  - CBC with Differential - fluocinonide-emollient (LIDEX-E) 0.05 % cream; Apply 1 application topically 2 (two) times daily.  Dispense: 30 g; Refill: 0 - benadryl at bedtime for sleep - RTC if new lesions occur after 7 days- derm referral  2. Cough -improving -continue OTC cough suppressant, albuterol inhaler   Emi Belfasteborah B. Gessner, FNP-BC  [facility]  11/05/2013 11:11 AM I have completed the patient encounter in its entirety as documented by FNP Leone PayorGessner, with editing by me where necessary. Robert P. Merla Richesoolittle, M.D.

## 2014-09-29 ENCOUNTER — Encounter: Payer: Self-pay | Admitting: *Deleted

## 2014-10-14 ENCOUNTER — Other Ambulatory Visit: Payer: Self-pay | Admitting: Family Medicine

## 2014-10-16 ENCOUNTER — Telehealth: Payer: Self-pay

## 2014-10-16 DIAGNOSIS — J45990 Exercise induced bronchospasm: Secondary | ICD-10-CM

## 2014-10-16 MED ORDER — ALBUTEROL SULFATE HFA 108 (90 BASE) MCG/ACT IN AERS: 2.0000 | INHALATION_SPRAY | Freq: Four times a day (QID) | RESPIRATORY_TRACT | Status: AC | PRN

## 2014-10-16 NOTE — Telephone Encounter (Addendum)
Clinical staff, can someone please call this patient about the message below regarding her mammogram...Marland KitchenMarland KitchenMarland Kitchen    "If you will, when you get back in contact with this patient, ask if she received the mammogram reminder letter I sent, please. And either refer her to me (ext [phone]) or ask if she's had one within past 2y, when, what imaging center, etc... If she refuses one, please document."   Thanks, Jaz!  Annabelle Harman, RN

## 2014-10-16 NOTE — Telephone Encounter (Signed)
Thank you!  Sorry I missed your call.

## 2014-10-16 NOTE — Telephone Encounter (Signed)
Patient is requesting a refill on her Albuterol. She states that she is going out of the country for 2 weeks and she would like to take this inhaler with her. Patient states that "she knows she needs an appointment". Last OV was 11/05/2013. Please advise.    [phone]

## 2014-10-16 NOTE — Telephone Encounter (Signed)
I refilled one inhaler. She will need to return for further refills.

## 2014-10-16 NOTE — Telephone Encounter (Signed)
Spoke with pt, she states she has not had a mammogram in the last two years but plans to make her appointment for this. I tried to call Lori Reed to see if she needs any other information but there was no answer.

## 2018-07-11 ENCOUNTER — Other Ambulatory Visit: Payer: Self-pay | Admitting: Podiatry

## 2018-07-11 ENCOUNTER — Ambulatory Visit (INDEPENDENT_AMBULATORY_CARE_PROVIDER_SITE_OTHER): Payer: BLUE CROSS/BLUE SHIELD

## 2018-07-11 ENCOUNTER — Other Ambulatory Visit: Payer: Self-pay

## 2018-07-11 ENCOUNTER — Ambulatory Visit: Payer: BLUE CROSS/BLUE SHIELD | Admitting: Podiatry

## 2018-07-11 DIAGNOSIS — M84364A Stress fracture, left fibula, initial encounter for fracture: Secondary | ICD-10-CM

## 2018-07-11 DIAGNOSIS — M25572 Pain in left ankle and joints of left foot: Secondary | ICD-10-CM

## 2018-07-11 DIAGNOSIS — M779 Enthesopathy, unspecified: Secondary | ICD-10-CM

## 2018-07-12 NOTE — Progress Notes (Signed)
Subjective:   Patient ID: Lori Reed, female   DOB: 58 y.o.   MRN: [id number]   HPI Patient presents stating she is having a lot of pain in her left outside ankle and just started to hurt and she is active and tries to run and does not remember specific injury with swelling also noted and exquisite pain when she tries to bear weight and is wearing a boot from years ago   Review of Systems  All other systems reviewed and are negative.       Objective:  Physical Exam Vitals signs and nursing note reviewed.  Constitutional:      Appearance: She is well-developed.  Pulmonary:     Effort: Pulmonary effort is normal.  Musculoskeletal: Normal range of motion.  Skin:    General: Skin is warm.  Neurological:     Mental Status: She is alert.     Neurovascular status intact muscle strength is adequate with severe flatfoot deformity left over right with what appears to be posterior tibial tendon dysfunction.  Patient has a lot of pain on the left fibula the bone itself with no indications of peroneal injury or other tendon pathology.  Patient states that the arch has been diminishing in height over the last few years and we had done previous surgery on her years ago.  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  I did apply air fracture walker to mobilize with air pump and gave instructions for elevation compression discussed the posterior tibial tendinitis and do think long-term orthotics will be necessary and reviewed this fact with hand.  Patient will be seen back 4 weeks or earlier if needed and will also utilize aggressive ice therapy multiple views of the left ankle do not show  current fracture but clinically it appears that there is a probable stress fracture of the fibula

## 2018-08-09 ENCOUNTER — Encounter: Payer: Self-pay | Admitting: Podiatry

## 2018-08-09 ENCOUNTER — Other Ambulatory Visit: Payer: Self-pay | Admitting: Podiatry

## 2018-08-09 ENCOUNTER — Ambulatory Visit: Payer: BLUE CROSS/BLUE SHIELD | Admitting: Podiatry

## 2018-08-09 ENCOUNTER — Ambulatory Visit (INDEPENDENT_AMBULATORY_CARE_PROVIDER_SITE_OTHER): Payer: BLUE CROSS/BLUE SHIELD

## 2018-08-09 ENCOUNTER — Other Ambulatory Visit: Payer: Self-pay

## 2018-08-09 VITALS — Temp 97.7°F

## 2018-08-09 DIAGNOSIS — M84364A Stress fracture, left fibula, initial encounter for fracture: Secondary | ICD-10-CM

## 2018-08-09 DIAGNOSIS — M76822 Posterior tibial tendinitis, left leg: Secondary | ICD-10-CM | POA: Diagnosis not present

## 2018-08-09 DIAGNOSIS — M84364D Stress fracture, left fibula, subsequent encounter for fracture with routine healing: Secondary | ICD-10-CM

## 2018-08-09 DIAGNOSIS — M76821 Posterior tibial tendinitis, right leg: Secondary | ICD-10-CM

## 2018-08-09 NOTE — Progress Notes (Signed)
Subjective:   Patient ID: Lori Reed, female   DOB: 58 y.o.   MRN: [id number]   HPI Patient states that it seems the ankle is getting better and the boot is helping but she does have foot instability and knows she needs orthotics   ROS      Objective:  Physical Exam  Neurovascular status intact with patient having a lot of pain in the lateral side of the left fibula that has improved quite a bit with diminished edema but still painful if I press deeply into the tissue severe flatfoot deformity bilateral     Assessment:  Posterior tibial tendinitis bilateral with severe flatfoot deformity and healing probable stress fracture of the fibula left     Plan:  X-rays reviewed and at this point continue boot and gradually reduce it over the next several months along with continued ice supportive shoes and dispense fascial brace to give more support during the.  When she is not wearing the boot.  Discussed long-term orthotics and I do want her to see the ped orthotist for orthotics as I do think she is got very unique foot structure with severe collapse medial longitudinal arch left over right.  Scheduled with ped orthotist at this time

## 2018-08-13 ENCOUNTER — Ambulatory Visit (INDEPENDENT_AMBULATORY_CARE_PROVIDER_SITE_OTHER): Payer: BLUE CROSS/BLUE SHIELD | Admitting: Orthotics

## 2018-08-13 ENCOUNTER — Other Ambulatory Visit: Payer: Self-pay

## 2018-08-13 DIAGNOSIS — M76821 Posterior tibial tendinitis, right leg: Secondary | ICD-10-CM | POA: Diagnosis not present

## 2018-08-13 DIAGNOSIS — M84364A Stress fracture, left fibula, initial encounter for fracture: Secondary | ICD-10-CM

## 2018-08-13 DIAGNOSIS — M779 Enthesopathy, unspecified: Secondary | ICD-10-CM

## 2018-08-13 DIAGNOSIS — M76822 Posterior tibial tendinitis, left leg: Secondary | ICD-10-CM | POA: Diagnosis not present

## 2018-08-13 NOTE — Progress Notes (Signed)
Patient presents today with a hx of PTTD/AAF.  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  Patient has medially shifted talus/navicular.  Goal is provide longitudinal arch support and RF stability.  Plan on deep heel cup, hug arch, wide foot orthosis w/ medial flange and varus correction for RF valgus deformity.  Patient educated in the progessive nature of PTTD and financial responsibility.  

## 2018-09-06 ENCOUNTER — Other Ambulatory Visit: Payer: Self-pay

## 2018-09-06 ENCOUNTER — Ambulatory Visit: Payer: BLUE CROSS/BLUE SHIELD | Admitting: Orthotics

## 2018-09-06 DIAGNOSIS — M25572 Pain in left ankle and joints of left foot: Secondary | ICD-10-CM

## 2018-09-06 NOTE — Progress Notes (Signed)
Patient came in today to pick up custom made foot orthotics.  The goals were accomplished and the patient reported no dissatisfaction with said orthotics.  Patient was advised of breakin period and how to report any issues.Patient came in today to pick up custom made foot orthotics.  The goals were accomplished and the patient reported no dissatisfaction with said orthotics.  Patient was advised of breakin period and how to report any issues. 

## 2019-01-31 ENCOUNTER — Encounter: Payer: Self-pay | Admitting: Podiatry

## 2019-02-05 ENCOUNTER — Other Ambulatory Visit: Payer: Self-pay

## 2019-02-05 ENCOUNTER — Ambulatory Visit: Payer: BC Managed Care – PPO | Admitting: Orthotics

## 2019-02-05 DIAGNOSIS — M84364A Stress fracture, left fibula, initial encounter for fracture: Secondary | ICD-10-CM

## 2019-02-05 DIAGNOSIS — M76821 Posterior tibial tendinitis, right leg: Secondary | ICD-10-CM

## 2019-02-05 DIAGNOSIS — M76822 Posterior tibial tendinitis, left leg: Secondary | ICD-10-CM

## 2019-02-05 NOTE — Progress Notes (Signed)
Need to widen heel a bit, horshoe cushion.

## 2019-02-26 ENCOUNTER — Other Ambulatory Visit: Payer: BC Managed Care – PPO | Admitting: Orthotics

## 2019-02-26 ENCOUNTER — Other Ambulatory Visit: Payer: Self-pay

## 2019-06-28 ENCOUNTER — Ambulatory Visit: Payer: BC Managed Care – PPO | Attending: Internal Medicine

## 2019-06-28 DIAGNOSIS — Z23 Encounter for immunization: Secondary | ICD-10-CM

## 2019-06-28 NOTE — Progress Notes (Signed)
   Covid-19 Vaccination Clinic  Name:  Lori Reed    MRN: [id number] DOB: March 27, 1961  06/28/2019  [name] was observed post Covid-19 immunization for 15 minutes without incident. She was provided with Vaccine Information Sheet and instruction to access the V-Safe system.   [name] was instructed to call 911 with any severe reactions post vaccine: Marland Kitchen Difficulty breathing  . Swelling of face and throat  . A fast heartbeat  . A bad rash all over body  . Dizziness and weakness

## 2019-07-29 ENCOUNTER — Telehealth: Payer: Self-pay | Admitting: Podiatry

## 2019-07-29 NOTE — Telephone Encounter (Signed)
Left message for pt to call us to schedule an appointment with Dr. Charlsie Merles.

## 2019-07-30 ENCOUNTER — Ambulatory Visit: Payer: BC Managed Care – PPO | Attending: Internal Medicine

## 2019-07-30 DIAGNOSIS — Z23 Encounter for immunization: Secondary | ICD-10-CM

## 2019-07-30 NOTE — Progress Notes (Signed)
   Covid-19 Vaccination Clinic  Name:  Lori Reed    MRN: [id number] DOB: July 14, 1960  07/30/2019  [name] was observed post Covid-19 immunization for 15 minutes without incident. She was provided with Vaccine Information Sheet and instruction to access the V-Safe system.   [name] was instructed to call 911 with any severe reactions post vaccine: Marland Kitchen Difficulty breathing  . Swelling of face and throat  . A fast heartbeat  . A bad rash all over body  . Dizziness and weakness   Immunizations Administered    Name Date Dose VIS Date Route   Pfizer COVID-19 Vaccine 07/30/2019  8:17 AM 0.3 mL 04/05/2019 Intramuscular   Manufacturer: [employer], [employer]   Lot: [id number]   NDC: [address]-2

## 2019-08-14 DIAGNOSIS — L719 Rosacea, unspecified: Secondary | ICD-10-CM | POA: Insufficient documentation

## 2019-08-19 ENCOUNTER — Encounter: Payer: Self-pay | Admitting: Podiatry

## 2019-08-19 ENCOUNTER — Other Ambulatory Visit: Payer: Self-pay

## 2019-08-19 ENCOUNTER — Ambulatory Visit: Payer: BC Managed Care – PPO | Admitting: Podiatry

## 2019-08-19 ENCOUNTER — Ambulatory Visit (INDEPENDENT_AMBULATORY_CARE_PROVIDER_SITE_OTHER): Payer: BC Managed Care – PPO

## 2019-08-19 ENCOUNTER — Other Ambulatory Visit: Payer: Self-pay | Admitting: Podiatry

## 2019-08-19 DIAGNOSIS — M76822 Posterior tibial tendinitis, left leg: Secondary | ICD-10-CM

## 2019-08-19 DIAGNOSIS — M76821 Posterior tibial tendinitis, right leg: Secondary | ICD-10-CM

## 2019-08-19 DIAGNOSIS — M779 Enthesopathy, unspecified: Secondary | ICD-10-CM

## 2019-08-19 NOTE — Progress Notes (Signed)
Subjective:   Patient ID: Lori Reed, female   DOB: 59 y.o.   MRN: [id number]   HPI Patient presents stating that the left foot is been hurting more in the subtalar joint and states the inside of the foot can become tired at the end of the day but is  sore and she knows about her severe flatfoot deformity   ROS      Objective:  Physical Exam  Neurovascular status intact with significant diminishment of the arch left with a fibula fracture which is healed but does have some changes in the ankle especially upon x-ray views with inflammation sinus tarsi.  Patient does have: Complete collapse medial longitudinal arch left further imbalance also occurring within the ankle joint     Assessment:  Patient has very poor foot structure with possibility for ankle instability and subtalar capsulitis     Plan:  H&P reviewed all conditions and today I injected the subtalar joint left 3 mg Kenalog 5 mg Xylocaine I discussed the instability of the talus and the ankle joint and that ultimately fusion may be necessary but at this point were going to try AFO bracing to see if it will make a big difference for her  X-rays indicate that there is significant declination of the talus and there is significant pathology within the ankle joint left.  I conveyed this to patient discussed case with Dr. Logan Bores who agrees with me

## 2019-09-09 ENCOUNTER — Ambulatory Visit (INDEPENDENT_AMBULATORY_CARE_PROVIDER_SITE_OTHER): Payer: BC Managed Care – PPO | Admitting: Orthotics

## 2019-09-09 ENCOUNTER — Other Ambulatory Visit: Payer: Self-pay

## 2019-09-09 DIAGNOSIS — M76821 Posterior tibial tendinitis, right leg: Secondary | ICD-10-CM

## 2019-09-09 DIAGNOSIS — M76822 Posterior tibial tendinitis, left leg: Secondary | ICD-10-CM

## 2019-09-16 ENCOUNTER — Encounter: Payer: Self-pay | Admitting: Podiatry

## 2019-09-16 ENCOUNTER — Ambulatory Visit (INDEPENDENT_AMBULATORY_CARE_PROVIDER_SITE_OTHER): Payer: BC Managed Care – PPO | Admitting: Podiatry

## 2019-09-16 ENCOUNTER — Other Ambulatory Visit: Payer: Self-pay

## 2019-09-16 VITALS — Temp 97.2°F

## 2019-09-16 DIAGNOSIS — M779 Enthesopathy, unspecified: Secondary | ICD-10-CM

## 2019-09-16 DIAGNOSIS — M76822 Posterior tibial tendinitis, left leg: Secondary | ICD-10-CM

## 2019-09-16 DIAGNOSIS — M25572 Pain in left ankle and joints of left foot: Secondary | ICD-10-CM | POA: Diagnosis not present

## 2019-09-16 DIAGNOSIS — M76821 Posterior tibial tendinitis, right leg: Secondary | ICD-10-CM

## 2019-09-18 NOTE — Progress Notes (Signed)
Subjective:   Patient ID: Lori Reed, female   DOB: 59 y.o.   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 Can have periodic injections which I conveyed to her and we will do this as needed and x-rays will be needed at next visit to see whether or not the arch is stabilized

## 2020-01-17 ENCOUNTER — Ambulatory Visit: Payer: BC Managed Care – PPO | Admitting: Podiatry

## 2022-11-08 ENCOUNTER — Encounter (INDEPENDENT_AMBULATORY_CARE_PROVIDER_SITE_OTHER): Payer: Self-pay | Admitting: Physician Assistant

## 2022-11-08 ENCOUNTER — Ambulatory Visit (INDEPENDENT_AMBULATORY_CARE_PROVIDER_SITE_OTHER): Payer: BC Managed Care – PPO | Admitting: Physician Assistant

## 2022-11-08 VITALS — BP 150/107 | HR 79 | Temp 98.0°F | Resp 18 | Ht 64.0 in | Wt 220.0 lb

## 2022-11-08 DIAGNOSIS — S90425A Blister (nonthermal), left lesser toe(s), initial encounter: Secondary | ICD-10-CM

## 2022-11-08 NOTE — Patient Instructions (Signed)
Keep the blister clean.   Use the corn donuts, or moleskin to minimize pain as this heals.   If you see spreading redness, streaks, or it develops thick cheesy discharge, please come back or travel to the emergency room.

## 2022-11-08 NOTE — Progress Notes (Signed)
[facility]  URGENT  CARE  PROGRESS NOTE     Patient: Meghan Cooper   Date: 11/08/2022   MRN: [id number]       Pearle Wandler is a 62 y.o. female      HISTORY     History obtained from: Patient    Chief Complaint   Patient presents with    Blister     Left big toe x 1 day iced and neosporin         HPI Pt presents with left big toe pain, believes it to be blister, as she's gotten blisters from these shoes previously.  Due to this, she has come in for further assistance.  Denies any other symptoms     Review of SystemsROS negative unless noted in hpi     History:    Pertinent Past Medical, Surgical, Family and Social History were reviewed.      No current outpatient medications on file.    Allergies   Allergen Reactions    Codeine        Medications and Allergies reviewed.    PHYSICAL EXAM     There were no vitals filed for this visit.    Physical Exam  Skin:     Comments: Sole side of left big toe: pea sized erythematous region, non tender, no fluctuance. Cap refill, distal sensation intact. Flexion limited by lesion.          UCC COURSE     There were no labs reviewed with this patient during the visit.    There were no x-rays reviewed with this patient during the visit.    No current facility-administered medications for this visit.       PROCEDURES     Procedures    MEDICAL DECISION MAKING     History, physical, labs/studies most consistent with blister as the diagnosis.        Chart Review:  Prior PCP, Specialist and/or ED notes reviewed today: No  Prior labs/images/studies reviewed today: No    Differential Diagnosis: blister, abscess    Due to history of blisters with the particular shoes, and clinical picture of blister, advised on supportive care       ASSESSMENT     No diagnosis found.             PLAN      PLAN: see MDM             No orders of the defined types were placed in this encounter.    Requested Prescriptions      No prescriptions requested or ordered in this encounter       Discussed results and diagnosis  with patient/family.  Reviewed warning signs for worsening condition, as well as, indications for follow-up with primary care physician and return to urgent care clinic.   Patient/family expressed understanding of instructions.     An After Visit Summary was provided to the patient.
